# Patient Record
Sex: Male | Born: 2000 | Race: White | Hispanic: No | Marital: Single | Smoking: Never smoker
Health system: Southern US, Community
[De-identification: ages and names within clinical notes are randomized; demographics above are authoritative.]

## PROBLEM LIST (undated history)

## (undated) HISTORY — PX: KNEE SURGERY: SHX244

---

## 2018-03-17 ENCOUNTER — Other Ambulatory Visit: Payer: Self-pay | Admitting: Family Medicine

## 2018-03-17 ENCOUNTER — Ambulatory Visit
Admission: RE | Admit: 2018-03-17 | Discharge: 2018-03-17 | Disposition: A | Discharge status: Home / Self Care | Payer: PRIVATE HEALTH INSURANCE | Source: Ambulatory Visit | Attending: Family Medicine | Admitting: Family Medicine

## 2018-03-17 DIAGNOSIS — X58XXXA Exposure to other specified factors, initial encounter: Secondary | ICD-10-CM | POA: Insufficient documentation

## 2018-03-17 DIAGNOSIS — M79645 Pain in left finger(s): Secondary | ICD-10-CM | POA: Diagnosis present

## 2018-03-17 DIAGNOSIS — R52 Pain, unspecified: Secondary | ICD-10-CM

## 2018-03-17 DIAGNOSIS — S62512A Displaced fracture of proximal phalanx of left thumb, initial encounter for closed fracture: Secondary | ICD-10-CM | POA: Insufficient documentation

## 2018-03-17 DIAGNOSIS — R609 Edema, unspecified: Secondary | ICD-10-CM

## 2018-06-18 ENCOUNTER — Other Ambulatory Visit: Payer: Self-pay | Admitting: Family Medicine

## 2018-06-18 DIAGNOSIS — S6991XA Unspecified injury of right wrist, hand and finger(s), initial encounter: Secondary | ICD-10-CM

## 2018-06-19 ENCOUNTER — Ambulatory Visit
Admission: RE | Admit: 2018-06-19 | Discharge: 2018-06-19 | Disposition: A | Discharge status: Home / Self Care | Payer: PRIVATE HEALTH INSURANCE | Attending: Family Medicine | Admitting: Family Medicine

## 2018-06-19 ENCOUNTER — Ambulatory Visit
Admission: RE | Admit: 2018-06-19 | Discharge: 2018-06-19 | Disposition: A | Discharge status: Home / Self Care | Payer: PRIVATE HEALTH INSURANCE | Source: Ambulatory Visit | Attending: Family Medicine | Admitting: Family Medicine

## 2018-06-19 DIAGNOSIS — S6991XA Unspecified injury of right wrist, hand and finger(s), initial encounter: Secondary | ICD-10-CM

## 2018-11-17 ENCOUNTER — Other Ambulatory Visit: Payer: Self-pay | Admitting: *Deleted

## 2018-11-17 DIAGNOSIS — Z20822 Contact with and (suspected) exposure to covid-19: Secondary | ICD-10-CM

## 2018-11-21 ENCOUNTER — Other Ambulatory Visit: Payer: Self-pay

## 2018-11-21 DIAGNOSIS — Z20822 Contact with and (suspected) exposure to covid-19: Secondary | ICD-10-CM

## 2018-11-26 LAB — NOVEL CORONAVIRUS, NAA: SARS-CoV-2, NAA: NOT DETECTED

## 2018-12-02 ENCOUNTER — Other Ambulatory Visit: Payer: Self-pay | Admitting: Family Medicine

## 2018-12-02 DIAGNOSIS — Z20822 Contact with and (suspected) exposure to covid-19: Secondary | ICD-10-CM

## 2018-12-02 DIAGNOSIS — Z20828 Contact with and (suspected) exposure to other viral communicable diseases: Secondary | ICD-10-CM

## 2018-12-09 LAB — NOVEL CORONAVIRUS, NAA: SARS-CoV-2, NAA: NOT DETECTED

## 2018-12-14 ENCOUNTER — Other Ambulatory Visit: Payer: Self-pay | Admitting: Family Medicine

## 2018-12-14 ENCOUNTER — Ambulatory Visit
Admission: RE | Admit: 2018-12-14 | Discharge: 2018-12-14 | Disposition: A | Discharge status: Home / Self Care | Payer: PRIVATE HEALTH INSURANCE | Source: Ambulatory Visit | Attending: Family Medicine | Admitting: Family Medicine

## 2018-12-14 ENCOUNTER — Other Ambulatory Visit: Payer: Self-pay

## 2018-12-14 ENCOUNTER — Ambulatory Visit
Admission: RE | Admit: 2018-12-14 | Discharge: 2018-12-14 | Disposition: A | Discharge status: Home / Self Care | Payer: PRIVATE HEALTH INSURANCE | Attending: Family Medicine | Admitting: Family Medicine

## 2018-12-14 DIAGNOSIS — S8991XA Unspecified injury of right lower leg, initial encounter: Secondary | ICD-10-CM | POA: Diagnosis not present

## 2018-12-14 DIAGNOSIS — S83511A Sprain of anterior cruciate ligament of right knee, initial encounter: Secondary | ICD-10-CM

## 2018-12-15 ENCOUNTER — Ambulatory Visit
Admission: RE | Admit: 2018-12-15 | Discharge: 2018-12-15 | Disposition: A | Discharge status: Home / Self Care | Payer: PRIVATE HEALTH INSURANCE | Source: Ambulatory Visit | Attending: Family Medicine | Admitting: Family Medicine

## 2018-12-15 DIAGNOSIS — S83511A Sprain of anterior cruciate ligament of right knee, initial encounter: Secondary | ICD-10-CM | POA: Insufficient documentation

## 2019-01-19 ENCOUNTER — Other Ambulatory Visit: Payer: Self-pay | Admitting: Family Medicine

## 2019-01-19 DIAGNOSIS — Z8619 Personal history of other infectious and parasitic diseases: Secondary | ICD-10-CM

## 2019-01-19 DIAGNOSIS — Z8616 Personal history of COVID-19: Secondary | ICD-10-CM

## 2019-01-24 ENCOUNTER — Other Ambulatory Visit (INDEPENDENT_AMBULATORY_CARE_PROVIDER_SITE_OTHER): Payer: PRIVATE HEALTH INSURANCE | Admitting: Family Medicine

## 2019-01-24 ENCOUNTER — Ambulatory Visit (INDEPENDENT_AMBULATORY_CARE_PROVIDER_SITE_OTHER): Payer: Self-pay

## 2019-01-24 ENCOUNTER — Other Ambulatory Visit: Payer: Self-pay

## 2019-01-24 DIAGNOSIS — Z8619 Personal history of other infectious and parasitic diseases: Secondary | ICD-10-CM

## 2019-01-24 DIAGNOSIS — Z0000 Encounter for general adult medical examination without abnormal findings: Secondary | ICD-10-CM | POA: Diagnosis not present

## 2019-01-24 DIAGNOSIS — Z8616 Personal history of COVID-19: Secondary | ICD-10-CM

## 2019-01-25 LAB — TROPONIN I: Troponin I: <0.01 ng/mL (ref 0.00–0.04)

## 2019-01-26 ENCOUNTER — Emergency Department
Admission: EM | Admit: 2019-01-26 | Discharge: 2019-01-27 | Disposition: A | Discharge status: Home / Self Care | Payer: No Typology Code available for payment source | Attending: Emergency Medicine | Admitting: Emergency Medicine

## 2019-01-26 ENCOUNTER — Other Ambulatory Visit: Payer: Self-pay

## 2019-01-26 ENCOUNTER — Encounter: Payer: Self-pay | Admitting: Emergency Medicine

## 2019-01-26 DIAGNOSIS — R202 Paresthesia of skin: Secondary | ICD-10-CM | POA: Insufficient documentation

## 2019-01-26 DIAGNOSIS — R338 Other retention of urine: Secondary | ICD-10-CM | POA: Diagnosis not present

## 2019-01-26 DIAGNOSIS — R339 Retention of urine, unspecified: Secondary | ICD-10-CM | POA: Diagnosis present

## 2019-01-26 LAB — URINALYSIS, COMPLETE (UACMP) WITH MICROSCOPIC
Bacteria, UA: NONE SEEN
Bilirubin Urine: NEGATIVE
Glucose, UA: NEGATIVE mg/dL
Ketones, ur: NEGATIVE mg/dL
Leukocytes,Ua: NEGATIVE
Nitrite: NEGATIVE
Protein, ur: NEGATIVE mg/dL
Specific Gravity, Urine: 1.023 (ref 1.005–1.030)
Squamous Epithelial / HPF: NONE SEEN (ref 0–5)
pH: 5 (ref 5.0–8.0)

## 2019-01-26 MED ORDER — SODIUM CHLORIDE 0.9 % IV BOLUS
1000.0000 mL | Freq: Once | INTRAVENOUS | Status: AC
  Administered 2019-01-26: 1000 mL via INTRAVENOUS

## 2019-01-26 MED ORDER — METOCLOPRAMIDE HCL 5 MG/ML IJ SOLN
10.0000 mg | Freq: Once | INTRAMUSCULAR | Status: AC
  Administered 2019-01-26: 10 mg via INTRAVENOUS
  Filled 2019-01-26: qty 2

## 2019-01-26 MED ORDER — ONDANSETRON 4 MG PO TBDP: 4.0000 mg | ORAL_TABLET | Freq: Three times a day (TID) | ORAL | 0 refills | Status: AC | PRN

## 2019-01-26 NOTE — ED Notes (Signed)
Patient given home hydrocodone and ibuprofen that was prescribed by surgery. Patient's coach at bedside. Ice applied to knee

## 2019-01-26 NOTE — ED Provider Notes (Signed)
[facility] Emergency Department Provider Note ____________________________________________  Time seen: 2020  I have reviewed the triage vital signs and the nursing notes.  HISTORY  Chief Complaint  Urinary Retention  HPI Mike Dickerson is a [age] y.o. male presents to the ED for evaluation of urinary retention. He is several hours post-op for a right knee ACL repair. [facility] by Dr.Diehl. He reports voiding prior to his surgery, but was never asked to void prior to discharge. He reports bladder pressure and inability to void despite several attempts prior to arrival. He denies nausea, vomiting, or abdominal pain.   History reviewed. No pertinent past medical history.  There are no active problems to display for this patient.   Past Surgical History:  Procedure Laterality Date  . KNEE SURGERY      Prior to Admission medications   Not on File    Allergies Patient has no known allergies.  No family history on file.  Social History Social History   Tobacco Use  . Smoking status: Never Smoker  . Smokeless tobacco: Never Used  Substance Use Topics  . Alcohol use: Yes  . Drug use: Never    Review of Systems  Constitutional: Negative for fever. Cardiovascular: Negative for chest pain. Respiratory: Negative for shortness of breath. Gastrointestinal: Negative for abdominal pain, vomiting and diarrhea. Genitourinary: Negative for dysuria. Reports urinary retention.  Musculoskeletal: Negative for back pain. Skin: Negative for rash. Neurological: Negative for headaches, focal weakness or numbness. ____________________________________________  PHYSICAL EXAM:  VITAL SIGNS: ED Triage Vitals  Enc Vitals Group     BP 01/26/19 1958 135/69     Pulse Rate 01/26/19 1958 76     Resp 01/26/19 1958 16     Temp 01/26/19 1958 98.5 F (36.9 C)     Temp Source 01/26/19 1958 Oral     SpO2 01/26/19 1958 100 %   Weight 01/26/19 1959 195 lb (88.5 kg)     Height 01/26/19 1959 6\' 4"  (1.93 m)     Head Circumference --      Peak Flow --      Pain Score 01/26/19 1959 10     Pain Loc --      Pain Edu? --      Excl. in GC? --     Constitutional: Alert and oriented. Well appearing and in no distress. Head: Normocephalic and atraumatic. Eyes: Conjunctivae are normal. Normal extraocular movements Cardiovascular: Normal rate, regular rhythm. Normal distal pulses. No CCE noted distally. Respiratory: Normal respiratory effort. No wheezes/rales/rhonchi. Gastrointestinal: Soft and nontender. No distention. Musculoskeletal: normal composite fist bilaterally. Nontender with normal range of motion in all extremities.  Neurologic:  Normal gross sensation. Normal speech and language. No gross focal neurologic deficits are appreciated. Skin:  Skin is warm, dry and intact. No rash noted. Psychiatric: Mood and affect are normal. Patient exhibits appropriate insight and judgment. ____________________________________________   LABS (pertinent positives/negatives) Labs Reviewed  URINALYSIS, COMPLETE (UACMP) WITH MICROSCOPIC - Abnormal; Notable for the following components:      Result Value   Color, Urine YELLOW (*)    APPearance CLEAR (*)    Hgb urine dipstick SMALL (*)    All other components within normal limits  ____________________________________________  PROCEDURES  NS 1000 ml IVP Metoclopramide 10 mg IVP Procedures ____________________________________________  INITIAL IMPRESSION / ASSESSMENT AND PLAN / ED COURSE  ED evaluation of complications following recent surgery.  Patient presents with postsurgical urinary retention.  He  had a bladder scan and Foley was placed with good return of clear yellow urine.  Patient reports improvement of his symptoms.  He was able to spontaneously void in the ED, prior to discharge.  He is advised to follow-up with his surgeon for continued symptoms of hand  paresthesias.  Return precautions have been reviewed.  [name] was evaluated in Emergency Department on 01/26/2019 for the symptoms described in the history of present illness. He was evaluated in the context of the global COVID-19 pandemic, which necessitated consideration that the patient might be at risk for infection with the SARS-CoV-2 virus that causes COVID-19. Institutional protocols and algorithms that pertain to the evaluation of patients at risk for COVID-19 are in a state of rapid change based on information released by regulatory bodies including the CDC and federal and state organizations. These policies and algorithms were followed during the patient's care in the ED. ____________________________________________  FINAL CLINICAL IMPRESSION(S) / ED DIAGNOSES  Final diagnoses:  Acute urinary retention  Paresthesias in left hand      Carmie End, Dannielle Karvonen, PA-C 01/26/19 2351    Carrie Mew, MD 01/30/19 [phone]

## 2019-01-26 NOTE — Discharge Instructions (Addendum)
Your exam and labs are essentially normal at this time. You have experienced transient post-surgical urinary retention and hand paresthesias. Follow-up with your surgeon for ongoing symptoms. Return to the ED as needed.

## 2019-01-26 NOTE — ED Triage Notes (Signed)
Pt in via POV; reports having ACL surgery today, states he has not voided since 0600 this morning.  Reports pressure to bladder, no other complaints at this time.  NAD noted.

## 2019-01-26 NOTE — ED Notes (Signed)
Provided pt with more water. PT has not yet been able to urinate

## 2019-01-26 NOTE — ED Notes (Signed)
ED Provider at bedside. 

## 2019-05-30 ENCOUNTER — Other Ambulatory Visit: Payer: Self-pay | Admitting: Family Medicine

## 2019-05-30 DIAGNOSIS — I400 Infective myocarditis: Secondary | ICD-10-CM

## 2019-05-30 DIAGNOSIS — Z8616 Personal history of COVID-19: Secondary | ICD-10-CM

## 2019-06-01 ENCOUNTER — Telehealth: Payer: PRIVATE HEALTH INSURANCE | Admitting: Family Medicine

## 2019-06-06 ENCOUNTER — Ambulatory Visit: Payer: PRIVATE HEALTH INSURANCE

## 2019-06-07 ENCOUNTER — Other Ambulatory Visit: Payer: Self-pay

## 2019-06-07 ENCOUNTER — Ambulatory Visit
Admission: RE | Admit: 2019-06-07 | Discharge: 2019-06-07 | Disposition: A | Discharge status: Home / Self Care | Payer: No Typology Code available for payment source | Source: Ambulatory Visit | Attending: Family Medicine | Admitting: Family Medicine

## 2019-06-07 ENCOUNTER — Other Ambulatory Visit
Admission: RE | Admit: 2019-06-07 | Discharge: 2019-06-07 | Disposition: A | Discharge status: Home / Self Care | Payer: No Typology Code available for payment source | Source: Ambulatory Visit | Attending: Family Medicine | Admitting: Family Medicine

## 2019-06-07 DIAGNOSIS — I400 Infective myocarditis: Secondary | ICD-10-CM | POA: Diagnosis present

## 2019-06-07 DIAGNOSIS — Z8616 Personal history of COVID-19: Secondary | ICD-10-CM | POA: Insufficient documentation

## 2019-06-07 DIAGNOSIS — U071 COVID-19: Secondary | ICD-10-CM

## 2019-06-07 LAB — TROPONIN I (HIGH SENSITIVITY): Troponin I (High Sensitivity): <2 ng/L (ref <18)

## 2019-06-07 NOTE — Progress Notes (Signed)
*  PRELIMINARY RESULTS* Echocardiogram 2D Echocardiogram has been performed.  Cristela Blue 06/07/2019, 10:27 AM

## 2019-06-09 ENCOUNTER — Encounter (INDEPENDENT_AMBULATORY_CARE_PROVIDER_SITE_OTHER): Payer: Self-pay

## 2019-06-09 ENCOUNTER — Other Ambulatory Visit: Payer: Self-pay

## 2019-06-09 ENCOUNTER — Other Ambulatory Visit (INDEPENDENT_AMBULATORY_CARE_PROVIDER_SITE_OTHER): Payer: No Typology Code available for payment source | Admitting: Family Medicine

## 2019-06-09 DIAGNOSIS — B2799 Infectious mononucleosis, unspecified with other complication: Secondary | ICD-10-CM

## 2019-06-10 LAB — CBC WITH DIFFERENTIAL/PLATELET
Basophils Absolute: 0 10*3/uL (ref 0.0–0.2)
Basos: 1 %
EOS (ABSOLUTE): 0 10*3/uL (ref 0.0–0.4)
Eos: 1 %
Hematocrit: 40 % (ref 37.5–51.0)
Hemoglobin: 14.2 g/dL (ref 13.0–17.7)
Immature Grans (Abs): 0 10*3/uL (ref 0.0–0.1)
Immature Granulocytes: 0 %
Lymphocytes Absolute: 1.6 10*3/uL (ref 0.7–3.1)
Lymphs: 25 %
MCH: 33.3 pg — ABNORMAL HIGH (ref 26.6–33.0)
MCHC: 35.5 g/dL (ref 31.5–35.7)
MCV: 94 fL (ref 79–97)
Monocytes Absolute: 0.5 10*3/uL (ref 0.1–0.9)
Monocytes: 8 %
Neutrophils Absolute: 4.4 10*3/uL (ref 1.4–7.0)
Neutrophils: 65 %
Platelets: 210 10*3/uL (ref 150–450)
RBC: 4.27 x10E6/uL (ref 4.14–5.80)
RDW: 11.8 % (ref 11.6–15.4)
WBC: 6.6 10*3/uL (ref 3.4–10.8)

## 2019-06-10 LAB — MONONUCLEOSIS SCREEN: Mono Screen: NEGATIVE

## 2019-06-14 ENCOUNTER — Other Ambulatory Visit: Payer: Self-pay | Admitting: Family Medicine

## 2019-06-14 MED ORDER — AMOXICILLIN 500 MG PO CAPS: 500.0000 mg | ORAL_CAPSULE | Freq: Two times a day (BID) | ORAL | 0 refills | Status: AC

## 2020-03-14 IMAGING — MR MRI OF THE RIGHT KNEE WITHOUT CONTRAST
7 series · 40 of 40 positions shown · non-contrast
Comparison: None.

CLINICAL DATA: Injured knee while working [DATE] days ago. Pain and
swelling.

EXAM:
MRI OF THE RIGHT KNEE WITHOUT CONTRAST
TECHNIQUE: Multiplanar, multisequence MR imaging of the knee was performed. No
intravenous contrast was administered.

[Series 23: T2 fat-sat · axial · right · 4.0mm · 0.50mm/px · z∈[-102,+38]mm · 5 of 29 slices shown (1 of 3)]
[im 1/29]
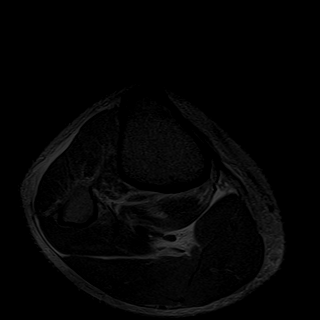
[im 8/29]
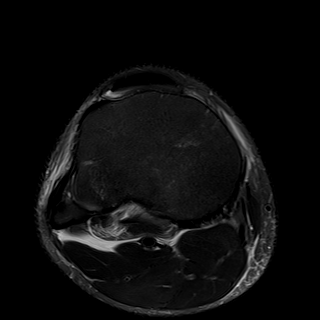
[im 15/29]
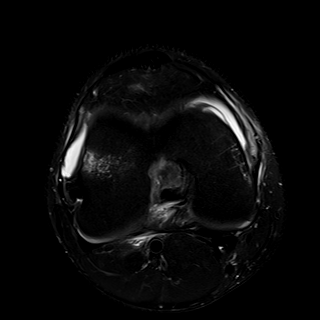
[im 22/29]
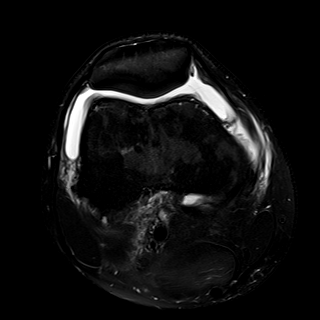
[im 29/29]
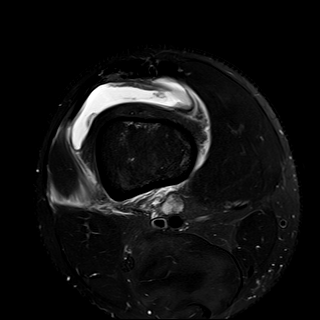

[Series 24: T1 · coronal · right · 4.0mm · 0.42mm/px · 6 of 32 slices shown]
[im 1/32]
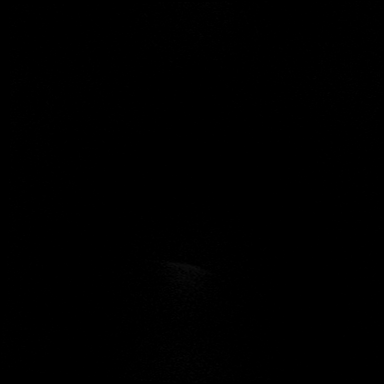
[im 7/32]
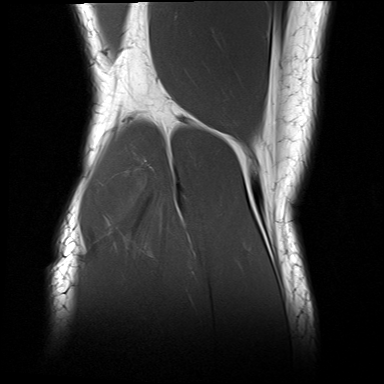
[im 13/32]
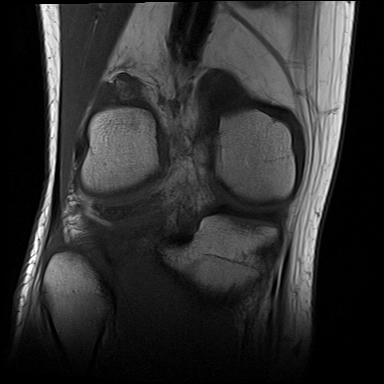
[im 19/32]
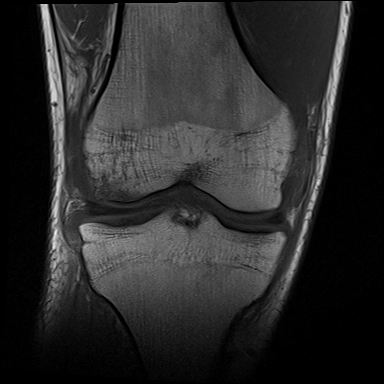
[im 25/32]
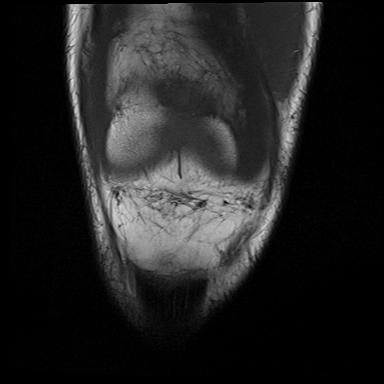
[im 32/32]
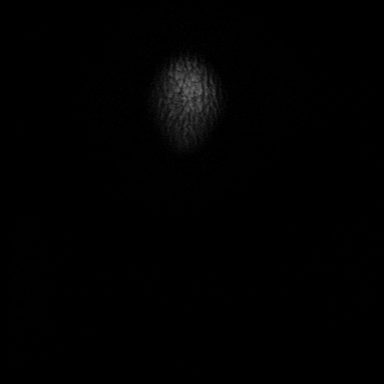

[Series 25: T2 fat-sat · coronal · right · 4.0mm · 0.59mm/px · 6 of 32 slices shown (2 of 3)]
[im 1/32]
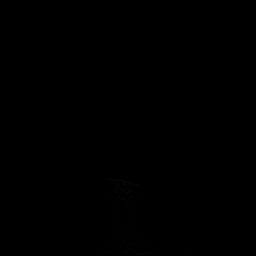
[im 7/32]
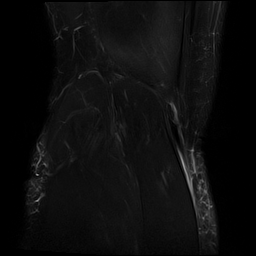
[im 13/32]
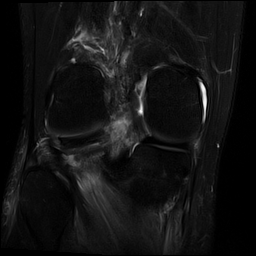
[im 19/32]
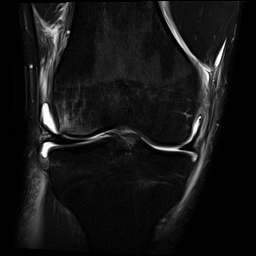
[im 25/32]
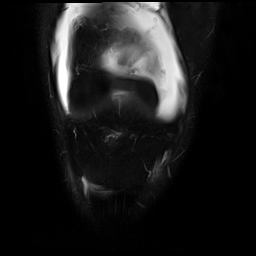
[im 32/32]
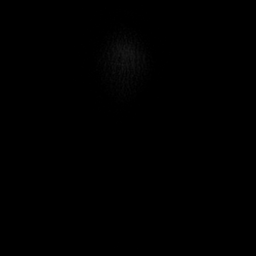

[Series 26: PD fat-sat · sagittal · right · 3.0mm · 0.59mm/px · 7 of 36 slices shown (1 of 2)]
[im 1/36]
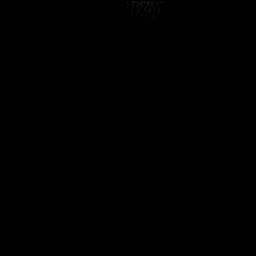
[im 6/36]
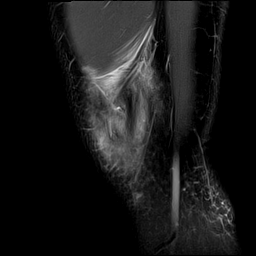
[im 12/36]
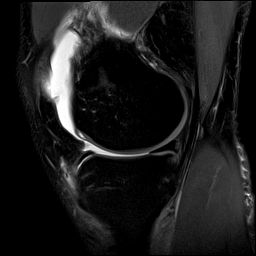
[im 18/36]
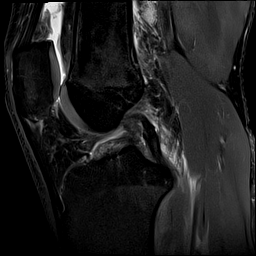
[im 24/36]
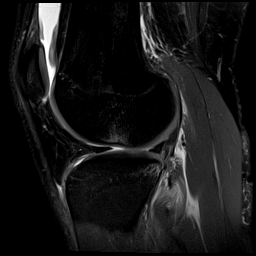
[im 30/36]
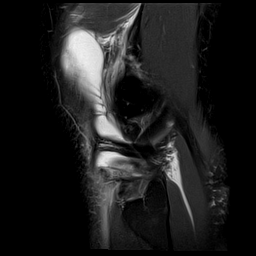
[im 36/36]
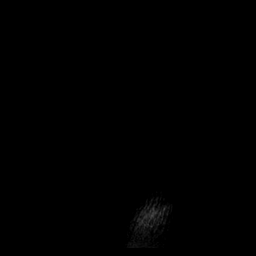

[Series 27: PD fat-sat · coronal · right · 4.0mm · 0.59mm/px · 6 of 32 slices shown (2 of 2)]
[im 1/32]
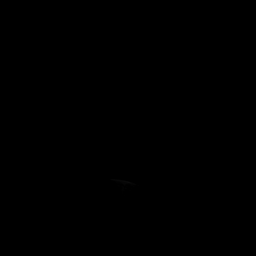
[im 7/32]
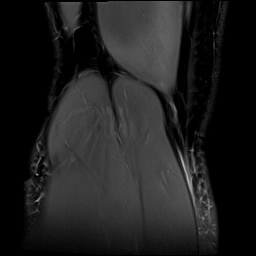
[im 13/32]
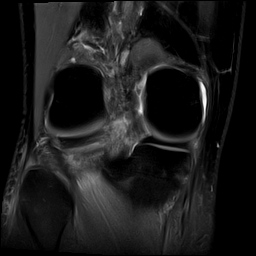
[im 19/32]
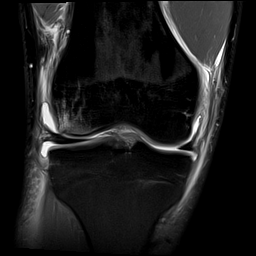
[im 25/32]
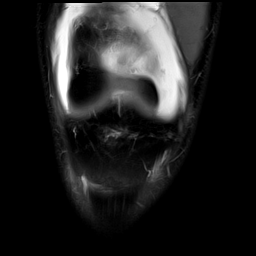
[im 32/32]
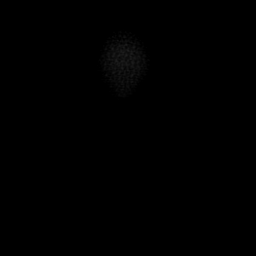

[Series 28: T2 fat-sat · sagittal · right · 3.0mm · 0.59mm/px · 7 of 36 slices shown (3 of 3)]
[im 1/36]
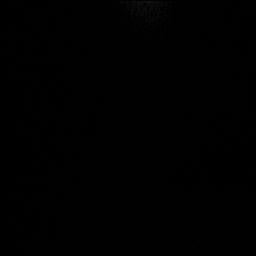
[im 6/36]
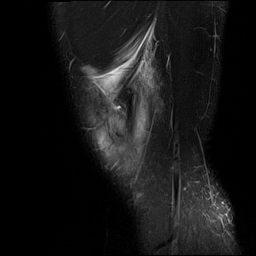
[im 12/36]
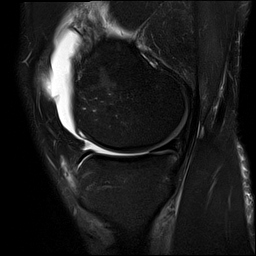
[im 18/36]
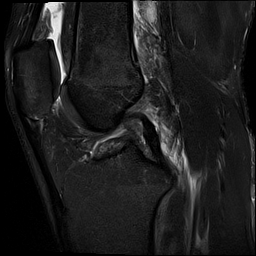
[im 24/36]
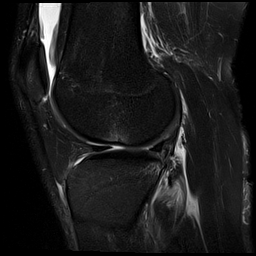
[im 30/36]
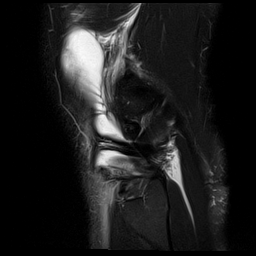
[im 36/36]
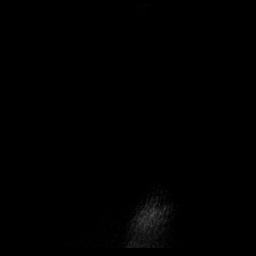

[Series 29: PD · oblique · right · 2.0mm · 0.47mm/px · 3 of 14 slices shown]
[im 1/14]
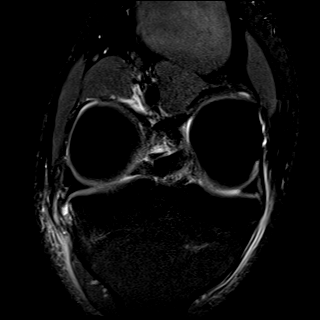
[im 7/14]
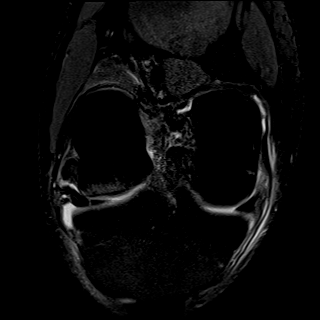
[im 14/14]
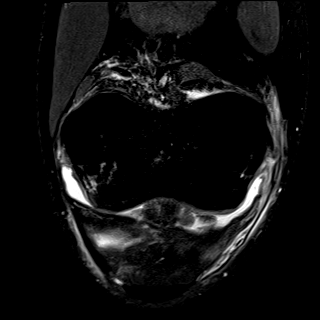

[40 of 40 positions shown; findings below may reference images not displayed]

FINDINGS: MENISCI

Medial meniscus:  Intact

Lateral meniscus: There is a free edge tear involving the posterior
horn and a small radial tear involving the anterior horn.

LIGAMENTS

Cruciates: Complete midsubstance ACL rupture. The PCL is intact but
slightly buckled.

Collaterals: Fibular collateral ligament sprain but no complete
tear. The MCL complex is intact.

CARTILAGE

Patellofemoral:  Normal

Medial:  Normal

Lateral:  Normal

Joint:  Large joint effusion.

Popliteal Fossa: No popliteal mass or Baker's cyst. There is a
high-grade tear at the musculotendinous junction region of the
popliteus muscle. The tendon is intact to its attachment site.
Moderate fluid surrounding the muscle.

Extensor Mechanism: The patella retinacular structures are intact
and the quadriceps and patellar tendons are intact.

Bones:  Pivot-shift bone contusions.

Other: The quadriceps muscles are intact. There is mild edema like
signal abnormality in the semimembranosus muscle which could be a
muscle sprain.
IMPRESSION: 1. Complete midsubstance ACL rupture with associated typical
pivot-shift bone contusions.
2. Fibular collateral ligament sprain without discrete tear. The MCL
complex is intact.
3. Anterior posterior horn lateral meniscal tears.
4. High-grade tear of the popliteus muscle at the musculotendinous
junction region. Probable muscle strain involving the
semimembranosus muscle.
5. Large joint effusion.
